# Patient Record
Sex: Male | Born: 1990 | Race: Black or African American | Hispanic: No | Marital: Single | Smoking: Never smoker
Health system: Southern US, Community
[De-identification: ages and names within clinical notes are randomized; demographics above are authoritative.]

---

## 2013-03-25 ENCOUNTER — Encounter (HOSPITAL_COMMUNITY): Payer: Self-pay | Admitting: Emergency Medicine

## 2013-03-25 ENCOUNTER — Emergency Department (HOSPITAL_COMMUNITY): Payer: 59

## 2013-03-25 ENCOUNTER — Emergency Department (HOSPITAL_COMMUNITY)
Admission: EM | Admit: 2013-03-25 | Discharge: 2013-03-25 | Disposition: A | Discharge status: Home / Self Care | Payer: 59 | Attending: Emergency Medicine | Admitting: Emergency Medicine

## 2013-03-25 DIAGNOSIS — R062 Wheezing: Secondary | ICD-10-CM | POA: Insufficient documentation

## 2013-03-25 DIAGNOSIS — J069 Acute upper respiratory infection, unspecified: Secondary | ICD-10-CM

## 2013-03-25 DIAGNOSIS — R0602 Shortness of breath: Secondary | ICD-10-CM | POA: Insufficient documentation

## 2013-03-25 DIAGNOSIS — R0789 Other chest pain: Secondary | ICD-10-CM | POA: Insufficient documentation

## 2013-03-25 DIAGNOSIS — R0982 Postnasal drip: Secondary | ICD-10-CM | POA: Insufficient documentation

## 2013-03-25 DIAGNOSIS — J029 Acute pharyngitis, unspecified: Secondary | ICD-10-CM

## 2013-03-25 LAB — RAPID STREP SCREEN (MED CTR MEBANE ONLY): STREPTOCOCCUS, GROUP A SCREEN (DIRECT): NEGATIVE

## 2013-03-25 MED ORDER — ALBUTEROL SULFATE HFA 108 (90 BASE) MCG/ACT IN AERS: 1.0000 | INHALATION_SPRAY | Freq: Four times a day (QID) | RESPIRATORY_TRACT | Status: AC | PRN

## 2013-03-25 NOTE — ED Notes (Signed)
Pt. Stated, I started having chest pian last night off and on with a sore throat.  Its like an aching pain.

## 2013-03-25 NOTE — ED Provider Notes (Signed)
Medical screening examination/treatment/procedure(s) were performed by non-physician practitioner and as supervising physician I was immediately available for consultation/collaboration.  EKG Interpretation    Date/Time:  Saturday March 25 2013 10:16:54 EST Ventricular Rate:  72 PR Interval:  150 QRS Duration: 86 QT Interval:  340 QTC Calculation: 372 R Axis:   75 Text Interpretation:  Normal sinus rhythm Normal ECG No old tracing to compare Confirmed by Ethelda ChickJACUBOWITZ  MD, Jaggar Benko ([id number]) on 03/25/2013 10:31:38 AM             Doug SouSam Britani Beattie, MD 03/25/13 1654

## 2013-03-25 NOTE — Discharge Instructions (Signed)
Take the prescribed medication as directed for feelings of shortness of breath or wheezing. May use over the counter chloraseptic or salt water gargles to help with sore throat. Return to the ED for new or worsening symptoms.

## 2013-03-25 NOTE — ED Notes (Signed)
Pt states sore throat, chest congestion and chest pain for several days. Respirations unlabored. Lung sounds clear and equal bilaterally. Pt is conscious alert and oriented x4. Skin warm and dry. Ambulatory to exam room without difficulty.

## 2013-03-25 NOTE — ED Provider Notes (Signed)
CSN: [id number]     Arrival date & time 03/25/13  1011 History   First MD Initiated Contact with Patient 03/25/13 1024     Chief Complaint  Patient presents with  . Chest Pain  . Sore Throat   (Consider location/radiation/quality/duration/timing/severity/associated sxs/prior Treatment) Patient is a 23 y.o. male presenting with chest pain and pharyngitis. The history is provided by the patient and medical records.  Chest Pain Associated symptoms: cough and shortness of breath   Sore Throat Associated symptoms include chest pain, congestion, coughing and a sore throat.   This is a 23 year old male with no significant past medical history presenting to the ED for URI symptoms over the past week. Specifically patient has had sore throat, nasal congestion, wheezing, mild shortness of breath, some pain with deep breathing, and generalized headaches. Denies fevers or chills.  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 He did receive a flu shot this year. Patient has not tried any medications for his symptoms. He states he gets recurrent strep pharyngitis, usually several times a year.  Denies any difficulty swallowing. No known family cardiac history.  Pt is not a smoker.  VS stable on arrival.  History reviewed. No pertinent past medical history. History reviewed. No pertinent past surgical history. No family history on file. History  Substance Use Topics  . Smoking status: Never Smoker   . Smokeless tobacco: Not on file  . Alcohol Use: No    Review of Systems  HENT: Positive for congestion, postnasal drip, rhinorrhea and sore throat.   Respiratory: Positive for cough and shortness of breath.   Cardiovascular: Positive for chest pain.  All other systems reviewed and are negative.    Allergies  Review of patient's allergies indicates no known allergies.  Home Medications   Current Outpatient Rx  Name  Route  Sig  Dispense  Refill  .  PRESCRIPTION MEDICATION   Oral   Take 1 tablet by mouth 2 (two) times daily. Anti-Inflammatory         . PRESCRIPTION MEDICATION   Oral   Take 1 tablet by mouth at bedtime. Pain medication for TMJ          BP 134/86  Pulse 77  Temp(Src) 98.7 F (37.1 C) (Oral)  Resp 18  Ht 6\' 3"  (1.905 m)  Wt 330 lb (149.687 kg)  BMI 41.25 kg/m2  SpO2 99%  Physical Exam  Nursing note and vitals reviewed. Constitutional: He is oriented to person, place, and time. He appears well-developed and well-nourished. No distress.  HENT:  Head: Normocephalic and atraumatic.  Right Ear: Tympanic membrane and ear canal normal.  Left Ear: Tympanic membrane and ear canal normal.  Nose: Mucosal edema and rhinorrhea present.  Mouth/Throat: Oropharynx is clear and moist.  Turbinates swollen erythematous with clear rhinorrhea present and bilateral nares; tonsils 2+ bilaterally without exudate, uvula midline, no peritonsillar abscess, handling secretions appropriately, no difficulty swallowing or speaking  Eyes: Conjunctivae and EOM are normal. Pupils are equal, round, and reactive to light.  Neck: Normal range of motion. Neck supple.  Cardiovascular: Normal rate, regular rhythm and normal heart sounds.   Pulmonary/Chest: Effort normal and breath sounds normal. No respiratory distress. He has no wheezes. He has no rhonchi.  Normal work of breathing without accessory muscle use, no audible wheezes or rhonchi, patient able to speak in full complete sentences without difficulty  Abdominal: Soft. Bowel sounds are normal. There is no tenderness. There is  no guarding.  Musculoskeletal: Normal range of motion.  Neurological: He is alert and oriented to person, place, and time.  Skin: Skin is warm and dry. He is not diaphoretic.  Psychiatric: He has a normal mood and affect.    ED Course  Procedures (including critical care time) Labs Review Labs Reviewed  RAPID STREP SCREEN  CULTURE, GROUP A STREP   Imaging  Review Dg Chest 2 View  03/25/2013   CLINICAL DATA:  Chest pain  EXAM: CHEST  2 VIEW  COMPARISON:  None.  FINDINGS: The heart size and mediastinal contours are within normal limits. Both lungs are clear. The visualized skeletal structures are unremarkable.  IMPRESSION: No active cardiopulmonary disease.   Electronically Signed   By: Alcide Clever M.D.   On: 03/25/2013 12:30    EKG Interpretation    Date/Time:  Saturday March 25 2013 10:16:54 EST Ventricular Rate:  72 PR Interval:  150 QRS Duration: 86 QT Interval:  340 QTC Calculation: 372 R Axis:   75 Text Interpretation:  Normal sinus rhythm Normal ECG No old tracing to compare Confirmed by JACUBOWITZ  MD, SAM ([id number]) on 03/25/2013 10:31:38 AM            MDM   1. Viral URI   2. Pharyngitis    EKG normal sinus rhythm, no acute ischemic changes.  Chest x-ray is clear. Rapid strep negative, culture pending.  Patient's vital signs have remained stable without tachypnea or hypoxia, no signs of respiratory distress.  At this time I have low suspicion for ACS, PE, dissection, or other acute cardiac event-- sx likely related to URI.  Will discharge will albuterol inhaler.  Advised may use OTC chloraseptic spray or salt water gargles to help ease sore throat.  Discussed plan with pt, he agreed.  Return precautions advised.  Garlon Hatchet, PA-C 03/25/13 1317

## 2013-03-27 LAB — CULTURE, GROUP A STREP

## 2015-04-17 IMAGING — CR DG CHEST 2V
2 series · 2 of 2 positions shown · non-contrast
Comparison: None.

CLINICAL DATA: Chest pain

EXAM:
CHEST  2 VIEW

[w chest pa]
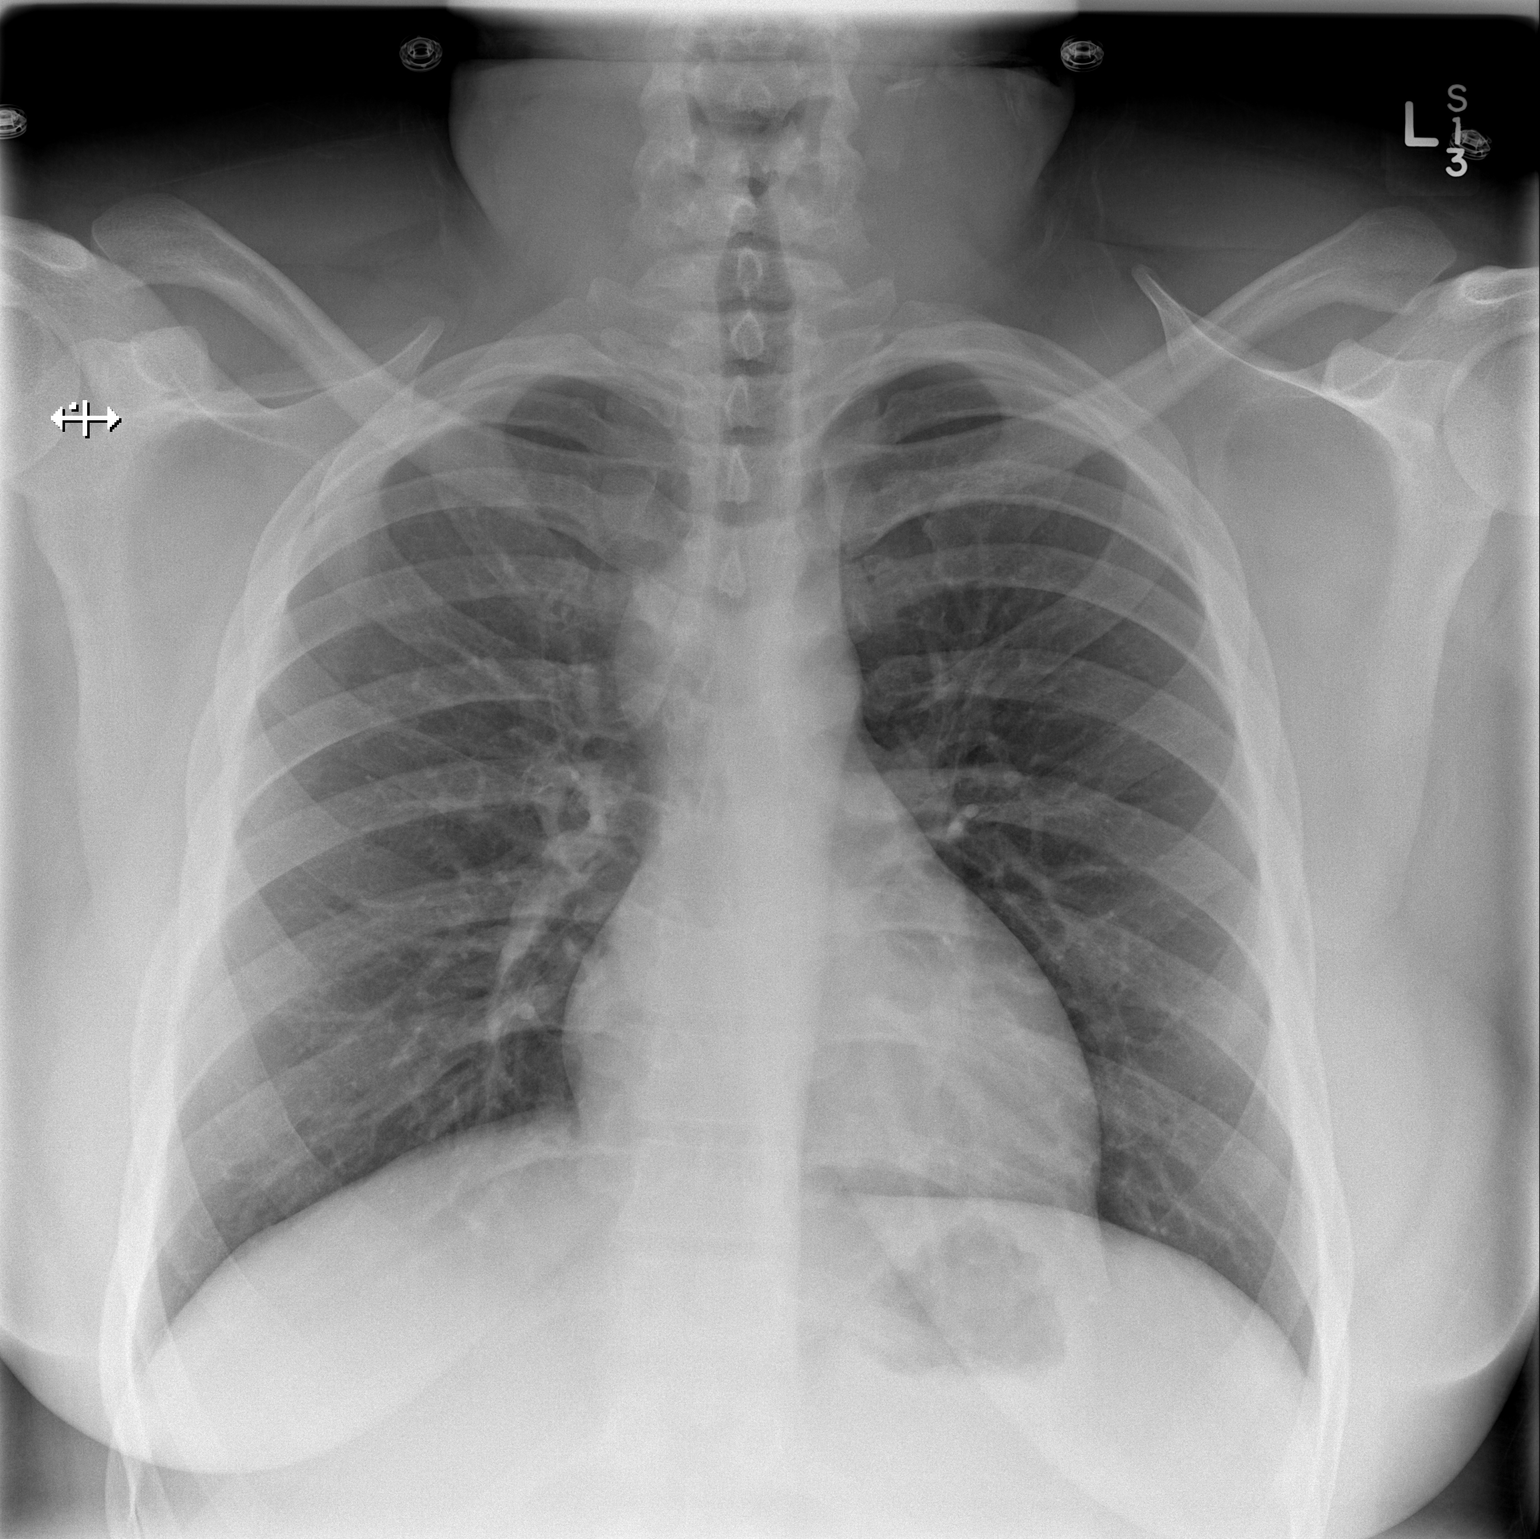

[w chest lat]
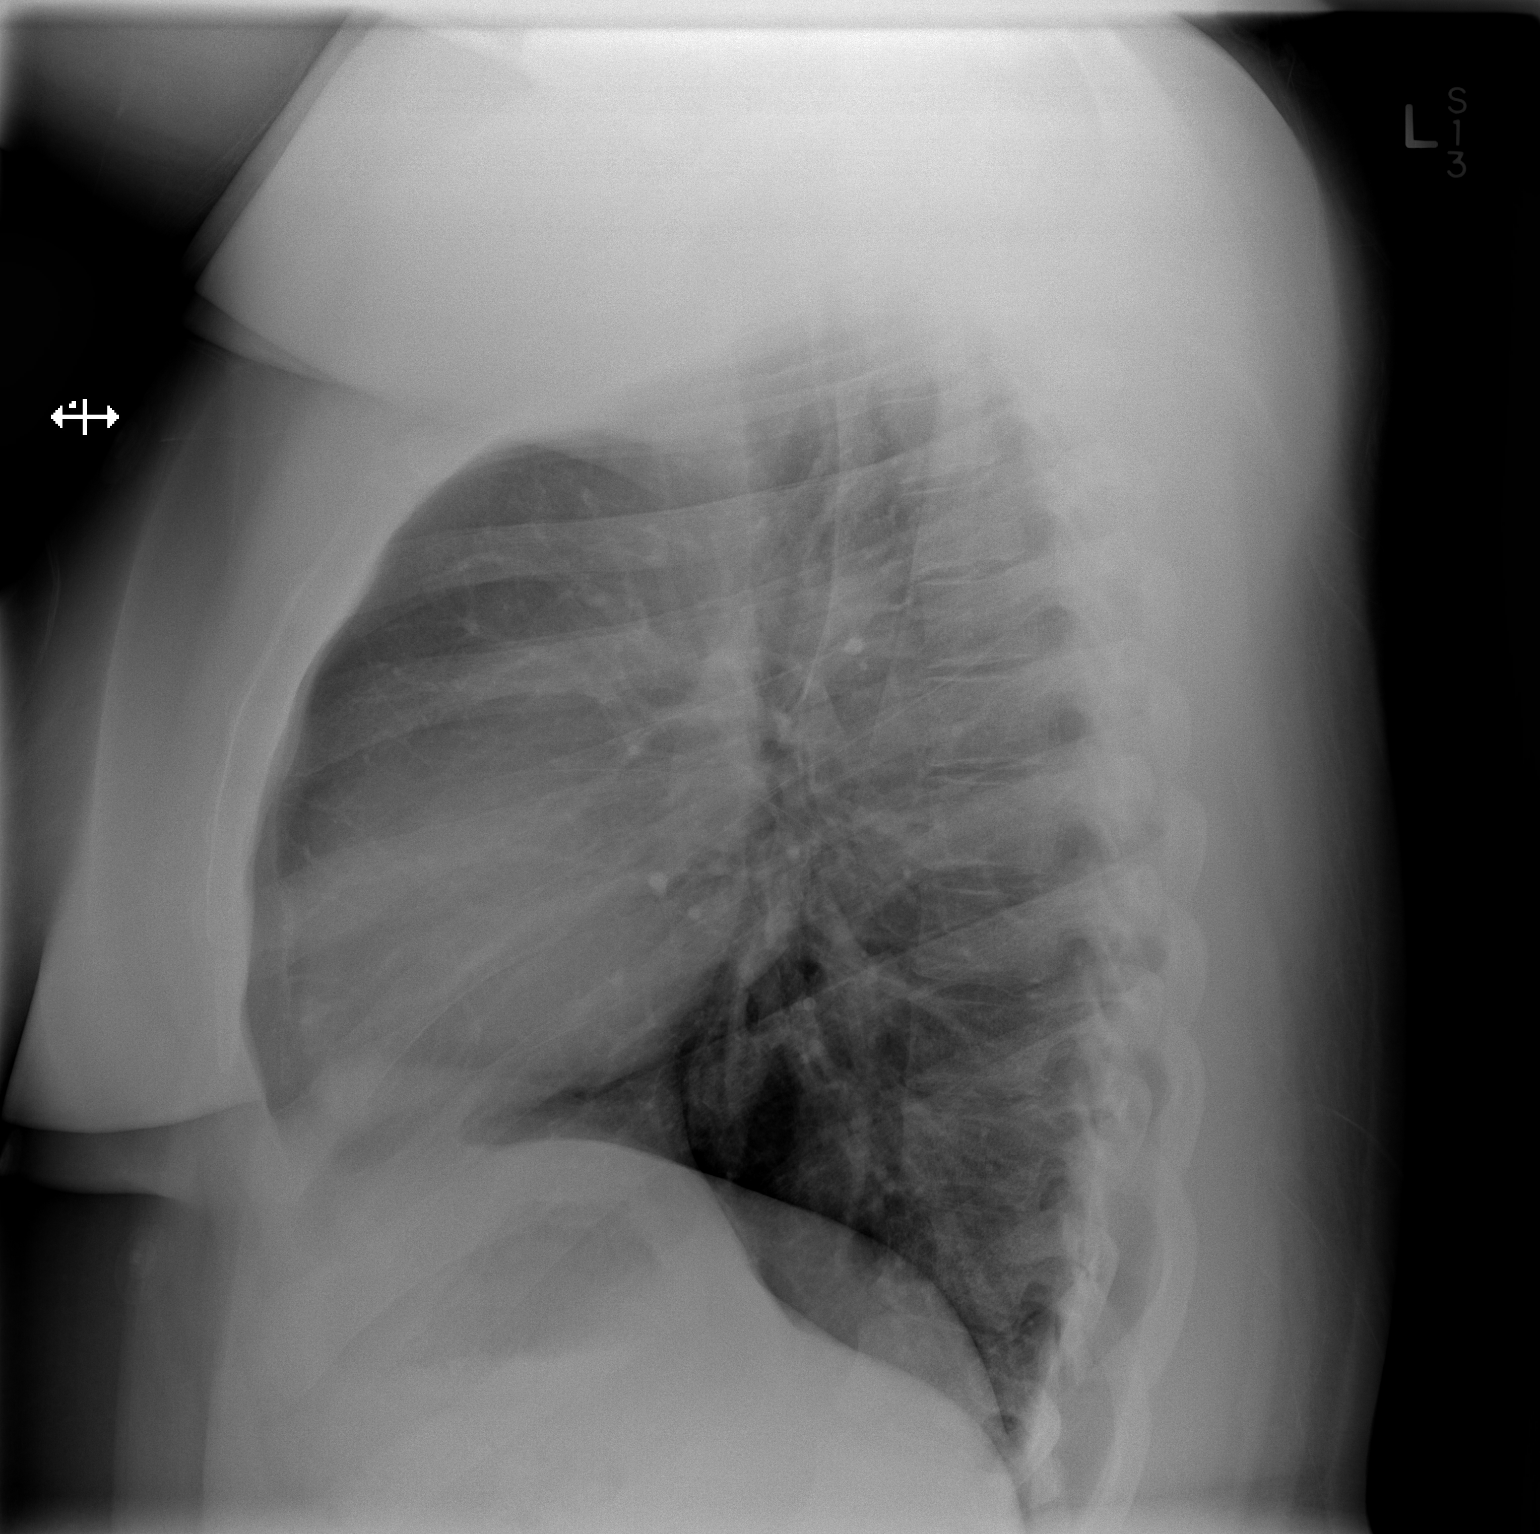

[2 of 2 positions shown; findings below may reference images not displayed]

FINDINGS: The heart size and mediastinal contours are within normal limits.
Both lungs are clear. The visualized skeletal structures are
unremarkable.
IMPRESSION: No active cardiopulmonary disease.

## 2016-07-13 ENCOUNTER — Encounter (HOSPITAL_COMMUNITY): Payer: Self-pay

## 2016-07-13 ENCOUNTER — Emergency Department (HOSPITAL_COMMUNITY)
Admission: EM | Admit: 2016-07-13 | Discharge: 2016-07-14 | Disposition: A | Discharge status: Home / Self Care | Payer: Commercial Managed Care - PPO | Attending: Emergency Medicine | Admitting: Emergency Medicine

## 2016-07-13 DIAGNOSIS — Y939 Activity, unspecified: Secondary | ICD-10-CM | POA: Diagnosis not present

## 2016-07-13 DIAGNOSIS — Y92410 Unspecified street and highway as the place of occurrence of the external cause: Secondary | ICD-10-CM | POA: Diagnosis not present

## 2016-07-13 DIAGNOSIS — Y999 Unspecified external cause status: Secondary | ICD-10-CM | POA: Diagnosis not present

## 2016-07-13 DIAGNOSIS — M546 Pain in thoracic spine: Secondary | ICD-10-CM | POA: Insufficient documentation

## 2016-07-13 DIAGNOSIS — Z79899 Other long term (current) drug therapy: Secondary | ICD-10-CM | POA: Insufficient documentation

## 2016-07-13 DIAGNOSIS — M545 Low back pain: Secondary | ICD-10-CM | POA: Diagnosis not present

## 2016-07-13 DIAGNOSIS — S299XXA Unspecified injury of thorax, initial encounter: Secondary | ICD-10-CM | POA: Diagnosis present
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
## 2016-07-14 MED ORDER — METHOCARBAMOL 500 MG PO TABS: 500.0000 mg | ORAL_TABLET | Freq: Three times a day (TID) | ORAL | 0 refills | Status: AC | PRN
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
